# Patient Record
Sex: Female | Born: 1985 | Race: Black or African American | Hispanic: No | Marital: Married | Smoking: Never smoker
Health system: Southern US, Community
[De-identification: ages and names within clinical notes are randomized; demographics above are authoritative.]

## PROBLEM LIST (undated history)

## (undated) DIAGNOSIS — I4891 Unspecified atrial fibrillation: Secondary | ICD-10-CM

## (undated) DIAGNOSIS — E119 Type 2 diabetes mellitus without complications: Secondary | ICD-10-CM

## (undated) DIAGNOSIS — F419 Anxiety disorder, unspecified: Secondary | ICD-10-CM

## (undated) DIAGNOSIS — M109 Gout, unspecified: Secondary | ICD-10-CM

## (undated) DIAGNOSIS — G4730 Sleep apnea, unspecified: Secondary | ICD-10-CM

## (undated) DIAGNOSIS — E785 Hyperlipidemia, unspecified: Secondary | ICD-10-CM

## (undated) DIAGNOSIS — I10 Essential (primary) hypertension: Secondary | ICD-10-CM

## (undated) DIAGNOSIS — D649 Anemia, unspecified: Secondary | ICD-10-CM

## (undated) HISTORY — DX: Hyperlipidemia, unspecified: E78.5

## (undated) HISTORY — DX: Type 2 diabetes mellitus without complications: E11.9

## (undated) HISTORY — PX: INCISION AND DRAINAGE PERIRECTAL ABSCESS: SHX1804

## (undated) HISTORY — PX: TONSILLECTOMY AND ADENOIDECTOMY: SUR1326

## (undated) HISTORY — PX: ABSCESS DRAINAGE: SHX1119

## (undated) HISTORY — DX: Essential (primary) hypertension: I10

## (undated) HISTORY — DX: Gout, unspecified: M10.9

## (undated) HISTORY — DX: Unspecified atrial fibrillation: I48.91

## (undated) HISTORY — DX: Anemia, unspecified: D64.9

---

## 2015-11-17 ENCOUNTER — Encounter: Payer: Self-pay | Admitting: *Deleted

## 2015-11-18 ENCOUNTER — Encounter: Payer: Self-pay | Admitting: *Deleted

## 2015-11-18 ENCOUNTER — Ambulatory Visit (INDEPENDENT_AMBULATORY_CARE_PROVIDER_SITE_OTHER): Payer: BLUE CROSS/BLUE SHIELD | Admitting: Cardiovascular Disease

## 2015-11-18 VITALS — BP 119/78 | HR 86 | Ht 67.0 in | Wt 260.0 lb

## 2015-11-18 DIAGNOSIS — R0683 Snoring: Secondary | ICD-10-CM

## 2015-11-18 DIAGNOSIS — R5383 Other fatigue: Secondary | ICD-10-CM | POA: Diagnosis not present

## 2015-11-18 DIAGNOSIS — D508 Other iron deficiency anemias: Secondary | ICD-10-CM

## 2015-11-18 DIAGNOSIS — N939 Abnormal uterine and vaginal bleeding, unspecified: Secondary | ICD-10-CM

## 2015-11-18 DIAGNOSIS — I480 Paroxysmal atrial fibrillation: Secondary | ICD-10-CM

## 2015-11-18 DIAGNOSIS — D259 Leiomyoma of uterus, unspecified: Secondary | ICD-10-CM

## 2015-11-18 DIAGNOSIS — Z87898 Personal history of other specified conditions: Secondary | ICD-10-CM

## 2015-11-18 DIAGNOSIS — Z9289 Personal history of other medical treatment: Secondary | ICD-10-CM

## 2015-11-18 DIAGNOSIS — R079 Chest pain, unspecified: Secondary | ICD-10-CM

## 2015-11-18 DIAGNOSIS — I10 Essential (primary) hypertension: Secondary | ICD-10-CM

## 2015-11-18 DIAGNOSIS — M1000 Idiopathic gout, unspecified site: Secondary | ICD-10-CM

## 2015-11-18 DIAGNOSIS — Z7189 Other specified counseling: Secondary | ICD-10-CM

## 2015-11-18 NOTE — Addendum Note (Signed)
Addended by: Laurine Blazer on: 11/18/2015 10:22 AM   Modules accepted: Orders

## 2015-11-18 NOTE — Patient Instructions (Addendum)
Your physician has requested that you have en exercise stress myoview. For further information please visit [email or web address]. Please follow instruction sheet, as given. Your physician has recommended that you have a sleep study. This test records several body functions during sleep, including: brain activity, eye movement, oxygen and carbon dioxide blood levels, heart rate and rhythm, breathing rate and rhythm, the flow of air through your mouth and nose, snoring, body muscle movements, and chest and belly movement. Office will contact with results via phone or letter.   Continue all current medications. Follow up in  2 months.

## 2015-11-18 NOTE — Progress Notes (Signed)
Patient ID: Jaime Ho, female   DOB: 02-16-1986, 30 y.o.   MRN: [id number]       CARDIOLOGY CONSULT NOTE  Patient ID: Jaime Ho MRN: [id number] DOB/AGE: 1985/10/03 30 y.o.  Admit date: (Not on file) Primary Physician: No primary care provider on file. Referring Physician: Beckie Salts MD  Reason for Consultation: atrial fibrillation  HPI: The patient is a 30 year old woman. She was hospitalized for acute vaginal bleeding secondary to Eliquis and uterine fibroids at [facility] one week ago. She was found to have new onset atrial fibrillation and had chest pressure with this a few days prior. She also has a history of hypertension, hyperlipidemia, gout, and diabetes. Troponins were checked and were negative. Atrial fibrillation spontaneously converted to normal sinus rhythm.   She had been hospitalized a few days prior to this for new onset age fibrillation at which time Eliquis and metoprolol were started. She was subsequently hospitalized for excessive vaginal bleeding. Eliquis was stopped.   I personally reviewed all labs, studies, and documentation pertaining to the most recent hospitalization. Labs 11/12/15 BUN 13, creatinine 0.8, magnesium 1.7, hemoglobin A1c 8.1%. Hemoglobin 9.5 on 5/26, platelets 354. A transvaginal ultrasound on 5/25 demonstrated uterine fibroids. EKGs I reviewed demonstrated sinus rhythm with nonspecific T-wave abnormalities.  For several months she has been feeling fatigued. She has had episodic chest tightness both with exertion and at rest. This chest pain has occasionally woken her up from sleep. She has had dizziness which has preceded the initiation of metoprolol. She has had episodic palpitations associated with a cough and she thought it was due to anxiety. She was found to be allergic to lisinopril and had an adverse reaction to both Actos and amlodipine. She has had problems with gout in spite of eliminating elements of her diet which would  increase uric acid levels. Upon further discussion, she has a history of snoring but has never had a sleep study.  She reportedly had an echocardiogram performed during her first hospitalization. I do not have a copy of this nor ECGs demonstrating atrial fibrillation. She is anemic. She is having Hemoccults checked by her PCP.    Allergies  Allergen Reactions  . Lisinopril Swelling    Throat swelling  . Shellfish Allergy Swelling    Throat swelling  . Actos [Pioglitazone]     ? Irregular heart beat? Unsure if it caused this but was taken off of it while inpatient at [facility]  . Amlodipine Other (See Comments)    Causes patient to urinate a lot  . Benadryl [Diphenhydramine] Hives  . Eliquis [Apixaban]     Caused patient to bleed and have clots    Current Outpatient Prescriptions  Medication Sig Dispense Refill  . allopurinol (ZYLOPRIM) 100 MG tablet Take 100 mg by mouth daily.    Marland Kitchen aspirin EC 81 MG tablet Take 81 mg by mouth daily as needed.    Marland Kitchen atorvastatin (LIPITOR) 20 MG tablet Take 20 mg by mouth daily.    Marland Kitchen docusate sodium (COLACE) 100 MG capsule Take 100 mg by mouth daily as needed for mild constipation.    . ferrous sulfate 325 (65 FE) MG tablet Take 325 mg by mouth daily with breakfast.    . glipiZIDE (GLUCOTROL) 10 MG tablet Take 10 mg by mouth daily before breakfast.    . hydrOXYzine (ATARAX/VISTARIL) 25 MG tablet Take 25 mg by mouth daily as needed.    . metFORMIN (GLUCOPHAGE) 1000 MG tablet Take 1,000 mg by mouth  2 (two) times daily with a meal.    . metoprolol tartrate (LOPRESSOR) 25 MG tablet Take 12.5 mg by mouth 2 (two) times daily.     Marland Kitchen triamterene-hydrochlorothiazide (MAXZIDE-25) 37.5-25 MG tablet Take 1 tablet by mouth daily.     No current facility-administered medications for this visit.    Past Medical History  Diagnosis Date  . Hypertension   . Hyperlipidemia   . Diabetes mellitus without complication ([place])   . Gout   . Atrial fibrillation ([place])   .  Anemia     Past Surgical History  Procedure Laterality Date  . Incision and drainage perirectal abscess    . Abscess drainage    . Tonsillectomy and adenoidectomy      Social History   Social History  . Marital Status: Married    Spouse Name: N/A  . Number of Children: N/A  . Years of Education: N/A   Occupational History  . Not on file.   Social History Main Topics  . Smoking status: Never Smoker   . Smokeless tobacco: Never Used  . Alcohol Use: No  . Drug Use: Not on file  . Sexual Activity: Not on file   Other Topics Concern  . Not on file   Social History Narrative     No family history of premature CAD in 1st degree relatives.  Prior to Admission medications   Medication Sig Start Date End Date Taking? Authorizing Provider  allopurinol (ZYLOPRIM) 100 MG tablet Take 100 mg by mouth daily.    Historical Provider, MD  atorvastatin (LIPITOR) 20 MG tablet Take 20 mg by mouth daily.    Historical Provider, MD  glipiZIDE (GLUCOTROL) 10 MG tablet Take 10 mg by mouth daily before breakfast.    Historical Provider, MD  meloxicam (MOBIC) 7.5 MG tablet Take 7.5 mg by mouth daily.    Historical Provider, MD  metFORMIN (GLUCOPHAGE) 1000 MG tablet Take 1,000 mg by mouth 2 (two) times daily with a meal.    Historical Provider, MD  metoprolol tartrate (LOPRESSOR) 25 MG tablet Take 25 mg by mouth 2 (two) times daily.    Historical Provider, MD  triamterene-hydrochlorothiazide (MAXZIDE-25) 37.5-25 MG tablet Take 1 tablet by mouth daily.    Historical Provider, MD     Review of systems complete and found to be negative unless listed above in HPI     Physical exam Blood pressure 119/78, pulse 86, height 5\' 7"  (1.702 m), weight 260 lb (117.935 kg). General: NAD Neck: No JVD, no thyromegaly or thyroid nodule.  Lungs: Clear to auscultation bilaterally with normal respiratory effort. CV: Nondisplaced PMI. Regular rate and rhythm, normal S1/S2, no S3/S4, no murmur.  Trivial  peripheral edema.  No carotid bruit.  Normal pedal pulses.  Abdomen: Soft, nontender, obese, no distention.  Skin: Intact without lesions or rashes.  Neurologic: Alert and oriented x 3.  Psych: Normal affect. Extremities: No clubbing or cyanosis.  HEENT: Normal.   ECG: Most recent ECG reviewed.  Labs:  No results found for: WBC, HGB, HCT, MCV, PLT No results for input(s): NA, K, CL, CO2, BUN, CREATININE, CALCIUM, PROT, BILITOT, ALKPHOS, ALT, AST, GLUCOSE in the last 168 hours.  Invalid input(s): LABALBU No results found for: CKTOTAL, CKMB, CKMBINDEX, TROPONINI No results found for: CHOL No results found for: HDL No results found for: LDLCALC No results found for: TRIG No results found for: CHOLHDL No results found for: LDLDIRECT       Studies: No results found.  ASSESSMENT  AND PLAN:  1. Paroxysmal atrial fibrillation: She is presently symptomatically stable and taking metoprolol 12.5 mg twice daily. She is currently not on anticoagulation due to profuse vaginal bleeding with fibroids. CHADSVASC score is 3 (HTN, DM, gender), thus anticoagulation is certainly warranted. However, given her profuse vaginal bleeding from uterine fibroids, this makes things challenging.  I have recommended she see her gynecologist as soon as possible to see if her uterine fibroids can be treated effectively. If so, I could challenge her again with anticoagulation which she requires to prevent a stroke. Given her snoring and fatigue, she may have sleep apnea which also is a risk factor for atrial fibrillation. I will proceed with a sleep study. I will obtain a copy of her echocardiogram as well. I will also try to obtain ECGs demonstrating atrial fibrillation.  2. Essential HTN: Controlled. No changes for now. Wonder if HCTZ is increasing uric acid levels and leading to gout flares.  3. Hyperlipidemia: Continue Lipitor.  4. Chest pain and fatigue: She has several risk factors for ischemic heart disease.  I will proceed with a nuclear myocardial perfusion imaging study (exercise Myoview) to evaluate for ischemic heart disease.  5. Snoring and fatigue: Given her snoring and fatigue, she may have sleep apnea which also is a risk factor for atrial fibrillation. I will proceed with a sleep study.  6. Gout: Wonder if HCTZ is increasing uric acid levels and leading to gout flares. The timing of initiation of hydrochlorothiazide and gout flares will need to be evaluated by her PCP. I have discussed this at length with the patient.  7. Anemia: Hemoccults being checked.  8. Vaginal bleeding and fibroids: I have recommended she see her gynecologist as soon as possible to see if her uterine fibroids can be treated effectively. If so, I could challenge her again with anticoagulation which she requires to prevent a stroke.  Dispo: fu 2 months.   Signed: Kate Sable, M.D., F.A.C.C.  11/18/2015, 9:23 AM

## 2015-11-26 ENCOUNTER — Encounter (HOSPITAL_COMMUNITY)
Admission: RE | Admit: 2015-11-26 | Discharge: 2015-11-26 | Disposition: A | Discharge status: Home / Self Care | Payer: BLUE CROSS/BLUE SHIELD | Source: Ambulatory Visit | Attending: Cardiovascular Disease | Admitting: Cardiovascular Disease

## 2015-11-26 ENCOUNTER — Inpatient Hospital Stay (HOSPITAL_COMMUNITY): Admission: RE | Admit: 2015-11-26 | Payer: BLUE CROSS/BLUE SHIELD | Source: Ambulatory Visit

## 2015-11-26 ENCOUNTER — Encounter (HOSPITAL_COMMUNITY): Payer: Self-pay

## 2015-11-26 DIAGNOSIS — R5383 Other fatigue: Secondary | ICD-10-CM

## 2015-11-26 DIAGNOSIS — R079 Chest pain, unspecified: Secondary | ICD-10-CM

## 2015-11-26 LAB — NM MYOCAR MULTI W/SPECT W/WALL MOTION / EF
CHL CUP NUCLEAR SDS: 3
CHL CUP NUCLEAR SRS: 0
CHL CUP NUCLEAR SSS: 3
CSEPED: 5 min
Estimated workload: 7 METS
Exercise duration (sec): 21 s
LV dias vol: 65 mL (ref 46–106)
LV sys vol: 27 mL
MPHR: 189 {beats}/min
Peak HR: 171 {beats}/min
Percent HR: 90 %
RATE: 0.33
RPE: 11
Rest HR: 83 {beats}/min
TID: 1

## 2015-11-26 MED ORDER — TECHNETIUM TC 99M TETROFOSMIN IV KIT
10.0000 | PACK | Freq: Once | INTRAVENOUS | Status: AC | PRN
  Administered 2015-11-26: 10.9 via INTRAVENOUS

## 2015-11-26 MED ORDER — TECHNETIUM TC 99M TETROFOSMIN IV KIT
30.0000 | PACK | Freq: Once | INTRAVENOUS | Status: AC | PRN
  Administered 2015-11-26: 32 via INTRAVENOUS

## 2015-11-26 MED ORDER — SODIUM CHLORIDE 0.9% FLUSH
INTRAVENOUS | Status: AC
  Administered 2015-11-26: 10 mL via INTRAVENOUS
  Filled 2015-11-26: qty 10

## 2015-11-26 MED ORDER — REGADENOSON 0.4 MG/5ML IV SOLN
INTRAVENOUS | Status: AC
  Filled 2015-11-26: qty 5

## 2016-01-17 ENCOUNTER — Ambulatory Visit: Payer: BLUE CROSS/BLUE SHIELD | Admitting: Cardiovascular Disease

## 2019-10-02 ENCOUNTER — Other Ambulatory Visit: Payer: Self-pay

## 2019-10-02 ENCOUNTER — Emergency Department (HOSPITAL_COMMUNITY)
Admission: EM | Admit: 2019-10-02 | Discharge: 2019-10-02 | Disposition: A | Discharge status: Home / Self Care | Payer: BC Managed Care – PPO | Attending: Emergency Medicine | Admitting: Emergency Medicine

## 2019-10-02 ENCOUNTER — Encounter (HOSPITAL_COMMUNITY): Payer: Self-pay

## 2019-10-02 ENCOUNTER — Emergency Department (HOSPITAL_COMMUNITY): Payer: BC Managed Care – PPO

## 2019-10-02 DIAGNOSIS — Z7984 Long term (current) use of oral hypoglycemic drugs: Secondary | ICD-10-CM | POA: Insufficient documentation

## 2019-10-02 DIAGNOSIS — E119 Type 2 diabetes mellitus without complications: Secondary | ICD-10-CM | POA: Insufficient documentation

## 2019-10-02 DIAGNOSIS — Z7982 Long term (current) use of aspirin: Secondary | ICD-10-CM | POA: Diagnosis not present

## 2019-10-02 DIAGNOSIS — D649 Anemia, unspecified: Secondary | ICD-10-CM | POA: Insufficient documentation

## 2019-10-02 DIAGNOSIS — R1030 Lower abdominal pain, unspecified: Secondary | ICD-10-CM | POA: Insufficient documentation

## 2019-10-02 DIAGNOSIS — D219 Benign neoplasm of connective and other soft tissue, unspecified: Secondary | ICD-10-CM | POA: Insufficient documentation

## 2019-10-02 DIAGNOSIS — Z79899 Other long term (current) drug therapy: Secondary | ICD-10-CM | POA: Diagnosis not present

## 2019-10-02 DIAGNOSIS — I10 Essential (primary) hypertension: Secondary | ICD-10-CM | POA: Insufficient documentation

## 2019-10-02 DIAGNOSIS — R1032 Left lower quadrant pain: Secondary | ICD-10-CM | POA: Diagnosis present

## 2019-10-02 DIAGNOSIS — N390 Urinary tract infection, site not specified: Secondary | ICD-10-CM | POA: Diagnosis not present

## 2019-10-02 DIAGNOSIS — R102 Pelvic and perineal pain: Secondary | ICD-10-CM

## 2019-10-02 HISTORY — DX: Anxiety disorder, unspecified: F41.9

## 2019-10-02 HISTORY — DX: Sleep apnea, unspecified: G47.30

## 2019-10-02 LAB — CBC WITH DIFFERENTIAL/PLATELET
Abs Immature Granulocytes: 0.02 10*3/uL (ref 0.00–0.07)
Basophils Absolute: 0 10*3/uL (ref 0.0–0.1)
Basophils Relative: 0 %
Eosinophils Absolute: 0.2 10*3/uL (ref 0.0–0.5)
Eosinophils Relative: 3 %
HCT: 27.8 % — ABNORMAL LOW (ref 36.0–46.0)
Hemoglobin: 8 g/dL — ABNORMAL LOW (ref 12.0–15.0)
Immature Granulocytes: 0 %
Lymphocytes Relative: 29 %
Lymphs Abs: 2 10*3/uL (ref 0.7–4.0)
MCH: 23 pg — ABNORMAL LOW (ref 26.0–34.0)
MCHC: 28.8 g/dL — ABNORMAL LOW (ref 30.0–36.0)
MCV: 79.9 fL — ABNORMAL LOW (ref 80.0–100.0)
Monocytes Absolute: 0.5 10*3/uL (ref 0.1–1.0)
Monocytes Relative: 7 %
Neutro Abs: 4.1 10*3/uL (ref 1.7–7.7)
Neutrophils Relative %: 61 %
Platelets: 349 10*3/uL (ref 150–400)
RBC: 3.48 MIL/uL — ABNORMAL LOW (ref 3.87–5.11)
RDW: 15.9 % — ABNORMAL HIGH (ref 11.5–15.5)
WBC: 6.8 10*3/uL (ref 4.0–10.5)
nRBC: 0 % (ref 0.0–0.2)

## 2019-10-02 LAB — COMPREHENSIVE METABOLIC PANEL
ALT: 23 U/L (ref 0–44)
AST: 26 U/L (ref 15–41)
Albumin: 3.5 g/dL (ref 3.5–5.0)
Alkaline Phosphatase: 99 U/L (ref 38–126)
Anion gap: 12 (ref 5–15)
BUN: 16 mg/dL (ref 6–20)
CO2: 21 mmol/L — ABNORMAL LOW (ref 22–32)
Calcium: 8.7 mg/dL — ABNORMAL LOW (ref 8.9–10.3)
Chloride: 102 mmol/L (ref 98–111)
Creatinine, Ser: 0.99 mg/dL (ref 0.44–1.00)
GFR calc Af Amer: >60 mL/min (ref >60)
GFR calc non Af Amer: >60 mL/min (ref >60)
Glucose, Bld: 145 mg/dL — ABNORMAL HIGH (ref 70–99)
Potassium: 4.2 mmol/L (ref 3.5–5.1)
Sodium: 135 mmol/L (ref 135–145)
Total Bilirubin: 0.5 mg/dL (ref 0.3–1.2)
Total Protein: 7.6 g/dL (ref 6.5–8.1)

## 2019-10-02 LAB — URINALYSIS, ROUTINE W REFLEX MICROSCOPIC
Bacteria, UA: NONE SEEN
Bilirubin Urine: NEGATIVE
Glucose, UA: NEGATIVE mg/dL
Hgb urine dipstick: NEGATIVE
Ketones, ur: NEGATIVE mg/dL
Leukocytes,Ua: NEGATIVE
Nitrite: NEGATIVE
Protein, ur: 100 mg/dL — AB
Specific Gravity, Urine: 1.009 (ref 1.005–1.030)
pH: 6 (ref 5.0–8.0)

## 2019-10-02 LAB — WET PREP, GENITAL
Clue Cells Wet Prep HPF POC: NONE SEEN
Sperm: NONE SEEN
Trich, Wet Prep: NONE SEEN
Yeast Wet Prep HPF POC: NONE SEEN

## 2019-10-02 LAB — PREGNANCY, URINE: Preg Test, Ur: NEGATIVE

## 2019-10-02 LAB — HIV ANTIBODY (ROUTINE TESTING W REFLEX): HIV Screen 4th Generation wRfx: NONREACTIVE

## 2019-10-02 LAB — LIPASE, BLOOD: Lipase: 47 U/L (ref 11–51)

## 2019-10-02 LAB — POC OCCULT BLOOD, ED: Fecal Occult Bld: NEGATIVE

## 2019-10-02 MED ORDER — FERROUS SULFATE 325 (65 FE) MG PO TABS: 325.0000 mg | ORAL_TABLET | Freq: Every day | ORAL | 0 refills | Status: AC

## 2019-10-02 NOTE — ED Provider Notes (Signed)
[facility] EMERGENCY DEPARTMENT Provider Note   CSN: [id number] Arrival date & time: 10/02/19  [id number]     History Chief Complaint  Patient presents with  . Pelvic Pain    Jaime Ho is a 34 y.o. female history A. fib, diabetes, hypertension, hyperlipidemia, sleep apnea, obesity.  Patient presents today for left lower quadrant pain.  Patient reports today that her pain began 9 days ago when she began her menstrual cycle.  With this was the normal time for her menstrual cycle to begin, it lasted around 5 days coming to and in this past Sunday.  She reports that throughout her menstrual cycle she had bilateral lower abdominal cramping which was consistent with her normal cycle.  She reports after her menstrual cycle ended she had some lingering pain in her left lower abdomen, a mild cramping constant nonradiating.  As pain continued she sought care at urgent care 2 days ago.  She reports she was diagnosed with UTI and yeast infection.  She is currently being treated for UTI with Keflex 500 twice daily as well as Diflucan for yeast infection.  Patient reports that her symptoms have begun to improve however as they were not completely resolved in 2 days she reports she was informed to return to the ER for evaluation of ovarian pain.  Secondarily patient mention eye drainage and nasal congestion to triage staff.  I brought this up with the patient she reports that she has allergies this time of year every year, she has been treating with Claritin with relief.  Of note patient has history of A. fib, apparently is paroxysmal, she is not on any blood thinners.  She reports he takes metoprolol for control.  She denies any palpitations, chest pain or shortness of breath.  Denies fever/chills, headache, vision changes, sore throat, neck stiffness, chest pain/shortness of breath, nausea/vomiting, dysuria/hematuria, vaginal bleeding, numbness/tingling, weakness or any additional concerns.  HPI      Past Medical History:  Diagnosis Date  . Anemia   . Anxiety   . Atrial fibrillation ([place])   . Diabetes mellitus without complication ([place])   . Gout   . Hyperlipidemia   . Hypertension   . Sleep apnea     There are no problems to display for this patient.   Past Surgical History:  Procedure Laterality Date  . ABSCESS DRAINAGE    . INCISION AND DRAINAGE PERIRECTAL ABSCESS    . TONSILLECTOMY AND ADENOIDECTOMY       OB History   No obstetric history on file.     Family History  Problem Relation Age of Onset  . Diabetes Mother   . Hypertension Mother   . Anemia Mother   . Hyperlipidemia Mother   . Stroke Maternal Aunt   . Heart attack Maternal Aunt   . Diabetes Maternal Grandmother   . Hypertension Maternal Grandmother   . Hyperlipidemia Maternal Grandmother   . Diabetes Maternal Grandfather   . Hypertension Maternal Grandfather   . Cancer Maternal Grandfather   . Heart attack Paternal Grandfather   . Anemia Sister   . Stroke Paternal Aunt   . Diabetes Other   . Hypertension Other     Social History   Tobacco Use  . Smoking status: Never Smoker  . Smokeless tobacco: Never Used  Substance Use Topics  . Alcohol use: No    Alcohol/week: 0.0 standard drinks  . Drug use: Not on file    Home Medications Prior to Admission medications  Medication Sig Start Date End Date Taking? Authorizing Provider  allopurinol (ZYLOPRIM) 100 MG tablet Take 100 mg by mouth daily.   Yes [provider]  aspirin EC 81 MG tablet Take 81 mg by mouth daily as needed.   Yes [provider]  atorvastatin (LIPITOR) 20 MG tablet Take 20 mg by mouth daily.   Yes [provider]  cephALEXin (KEFLEX) 500 MG capsule Take 500 mg by mouth 2 (two) times daily. 09/30/19  Yes [provider]  Cranberry 180 MG CAPS Take 2 capsules by mouth daily.   Yes [provider]  fluconazole (DIFLUCAN) 150 MG tablet Take 150 mg by mouth once. 09/30/19  Yes  [provider]  fluticasone (FLONASE) 50 MCG/ACT nasal spray Place 1 spray into both nostrils daily.   Yes [provider]  Glycerin-Hypromellose-PEG 400 (VISINE DRY EYE) 0.2-0.2-1 % SOLN Place 1-2 drops into both eyes daily as needed.   Yes [provider]  hydrochlorothiazide (HYDRODIURIL) 25 MG tablet Take 25 mg by mouth every morning. 09/25/19  Yes [provider]  hydrocortisone 2.5 % cream Apply 1 application topically daily as needed. 06/11/19  Yes [provider]  LANTUS SOLOSTAR 100 UNIT/ML Solostar Pen Inject 46 Units into the skin in the morning and at bedtime. 08/13/19  Yes [provider]  loratadine (CLARITIN) 10 MG tablet Take 10 mg by mouth daily.   Yes [provider]  losartan (COZAAR) 100 MG tablet Take 100 mg by mouth daily. 09/01/19  Yes [provider]  metFORMIN (GLUCOPHAGE) 1000 MG tablet Take 1,000 mg by mouth 2 (two) times daily with a meal.   Yes [provider]  metoprolol tartrate (LOPRESSOR) 50 MG tablet Take 50 mg by mouth 2 (two) times daily. 09/14/19  Yes [provider]  minocycline (MINOCIN) 100 MG capsule Take 100 mg by mouth daily as needed (acne).  05/30/19  Yes [provider]  olopatadine (PATADAY) 0.1 % ophthalmic solution Place 1 drop into both eyes 2 (two) times daily as needed for allergies.   Yes [provider]  omeprazole (PRILOSEC) 40 MG capsule Take 40 mg by mouth daily. 09/09/19  Yes [provider]  ferrous sulfate 325 (65 FE) MG tablet Take 1 tablet (325 mg total) by mouth daily. 10/02/19   Nuala Alpha A, PA-C    Allergies    Lisinopril, Shellfish allergy, Actos [pioglitazone], Amlodipine, Benadryl [diphenhydramine], and Eliquis [apixaban]  Review of Systems   Review of Systems Ten systems are reviewed and are negative for acute change except as noted in the HPI  Physical Exam Updated Vital Signs BP (!) 147/79 (BP Location:  Right Arm)   Pulse 96   Temp 98.5 F (36.9 C) (Oral)   Resp 18   Ht 5\' 7"  (1.702 m)   Wt 128.8 kg   LMP 09/24/2019   SpO2 100%   BMI 44.48 kg/m   Physical Exam Constitutional:      General: She is not in acute distress.    Appearance: Normal appearance. She is well-developed. She is not ill-appearing or diaphoretic.  HENT:     Head: Normocephalic and atraumatic.     Right Ear: External ear normal.     Left Ear: External ear normal.     Nose: Nose normal.  Eyes:     General: Vision grossly intact. Gaze aligned appropriately.     Pupils: Pupils are equal, round, and reactive to light.  Neck:     Trachea: Trachea  and phonation normal. No tracheal deviation.  Pulmonary:     Effort: Pulmonary effort is normal. No respiratory distress.  Abdominal:     General: There is no distension.     Palpations: Abdomen is soft.     Tenderness: There is abdominal tenderness in the suprapubic area and left lower quadrant. There is no guarding or rebound.  Genitourinary:    Comments: Exam chaperoned by [employer].  Pelvic exam: normal external genitalia without evidence of trauma. VULVA: normal appearing vulva with no masses, tenderness or lesion. VAGINA: normal appearing vagina with normal color and discharge, no lesions. CERVIX: normal appearing cervix without lesions, cervical motion tenderness absent, cervical os closed without purulent discharge; vaginal discharge scant white  Wet prep and DNA probe for chlamydia and GC obtained.  ADNEXA: normal adnexa in size, nontender and no masses UTERUS: uterus is normal size, shape, consistency and nontender.  Musculoskeletal:        General: Normal range of motion.     Cervical back: Normal range of motion.  Skin:    General: Skin is warm and dry.  Neurological:     Mental Status: She is alert.     GCS: GCS eye subscore is 4. GCS verbal subscore is 5. GCS motor subscore is 6.     Comments: Speech is clear and goal oriented, follows  commands Major Cranial nerves without deficit, no facial droop Moves extremities without ataxia, coordination intact  Psychiatric:        Behavior: Behavior normal.     ED Results / Procedures / Treatments   Labs (all labs ordered are listed, but only abnormal results are displayed) Labs Reviewed  WET PREP, GENITAL - Abnormal; Notable for the following components:      Result Value   WBC, Wet Prep HPF POC RARE (*)    All other components within normal limits  URINALYSIS, ROUTINE W REFLEX MICROSCOPIC - Abnormal; Notable for the following components:   Color, Urine STRAW (*)    Protein, ur 100 (*)    All other components within normal limits  CBC WITH DIFFERENTIAL/PLATELET - Abnormal; Notable for the following components:   RBC 3.48 (*)    Hemoglobin 8.0 (*)    HCT 27.8 (*)    MCV 79.9 (*)    MCH 23.0 (*)    MCHC 28.8 (*)    RDW 15.9 (*)    All other components within normal limits  COMPREHENSIVE METABOLIC PANEL - Abnormal; Notable for the following components:   CO2 21 (*)    Glucose, Bld 145 (*)    Calcium 8.7 (*)    All other components within normal limits  URINE CULTURE  PREGNANCY, URINE  LIPASE, BLOOD  RPR  HIV ANTIBODY (ROUTINE TESTING W REFLEX)  POC OCCULT BLOOD, ED  GC/CHLAMYDIA PROBE AMP ([place]) NOT AT [facility]    EKG None  Radiology US PELVIC COMPLETE W TRANSVAGINAL AND TORSION R/O  Result Date: 10/02/2019 CLINICAL DATA:  LEFT lower quadrant pain, current UTI, history hypertension, diabetes mellitus EXAM: TRANSABDOMINAL AND TRANSVAGINAL ULTRASOUND OF PELVIS DOPPLER ULTRASOUND OF OVARIES TECHNIQUE: Both transabdominal and transvaginal ultrasound examinations of the pelvis were performed. Transabdominal technique was performed for global imaging of the pelvis including uterus, ovaries, adnexal regions, and pelvic cul-de-sac. It was necessary to proceed with endovaginal exam following the transabdominal exam to visualize the endometrium and ovaries. Color and  duplex Doppler ultrasound was utilized to evaluate blood flow to the ovaries. COMPARISON:  11/11/2015 FINDINGS: Uterus Measurements:  9.4 x 4.4 x 6.9 cm = volume: 151 mL. Anteverted and minimally retroflexed. Posterior transmural leiomyoma at upper uterus 4.2 x 4.2 x 4.0 cm. Additional small intramural leiomyoma at fundus 2.3 x 2.1 x 3.3 cm. Small intramural leiomyoma at upper uterus 3.0 x 2.0 x 2.6 cm Endometrium Thickness: 15 mm.  No endometrial fluid or focal abnormality Right ovary Measurements: 4.8 x 3.1 x 3.9 cm = volume: 30 mL. Dominant follicle without additional mass. Blood flow present within RIGHT ovary on color Doppler imaging. Left ovary Measurements: 4.1 x 2.1 x 2.9 cm = volume: 13.1 mL. Normal morphology without mass. Blood flow present within LEFT ovary on color Doppler imaging. Pulsed Doppler evaluation of both ovaries demonstrates normal low-resistance arterial and venous waveforms. Other findings Small amount of free fluid in LEFT adnexa.  No adnexal masses. IMPRESSION: Three uterine leiomyomata are identified at least 1 of which extend submucosal as above. Unremarkable endometrial complex and ovaries. No evidence of ovarian torsion. Electronically Signed   By: Lavonia Dana M.D.   On: 10/02/2019 12:59    Procedures Procedures (including critical care time)  Medications Ordered in ED Medications - No data to display  ED Course  I have reviewed the triage vital signs and the nursing notes.  Pertinent labs & imaging results that were available during my care of the patient were reviewed by me and considered in my medical decision making (see chart for details).  Clinical Course as of Oct 02 1542  Thu Oct 02, 2019  1422 Vaughan Basta   [BM]    Clinical Course User Index [BM] Gari Crown   MDM Rules/Calculators/A&P                      34 year old female with history as detailed above presents today for concern of lower abdominal cramping.  This began when she had her normal  menstrual cycle last week, menstrual cycle ended on Sunday however lingering left lower quadrant pain brought patient to an urgent care where she was diagnosed with UTI and yeast infection.  She is been treated with Keflex and Diflucan with some relief however lingering pain and she presents today for evaluation of torsion.  Patient denies any infectious symptoms or additional concerns.  She has allergies which she reports are relieved with Claritin and this does not concern her during my examination. - I have reviewed patient's EMR, reviewed encounter from urgent care in [place].  I am unable to review provider's note however I can see labs and encounter diagnoses.  Med review from that encounter shows no blood thinners.  She was prescribed Keflex 500 mg twice daily x7 days and fluconazole 150 mg p.o. once.  Urinalysis reviewed shows high specific gravity, 100 glucose, 300 protein, RBCs, WBCs, moderate bacteria.  Yeast present.  10 squamous cells present.  Nitrite and ketone and leukocyte negative.  Pregnancy test negative. - I have ordered reviewed and interpreted the following labs.  Lipase within normal limits doubt pancreatitis.  CMP shows glucose 145, CO2 21, calcium 8.7 otherwise within normal limits.  No gap, emergently dry derangement, evidence of kidney injury or acute elevation of LFTs.  Patient does not appear to be in DKA.  Pregnancy test negative. Urinalysis shows 100 protein, 6-10 squamous cells otherwise within normal limits no evidence of infection.  Wet prep shows rare WBCs, negative trichomoniasis, negative yeast, negative clue cells.  CBC shows hemoglobin 8.0, no prior to compare, no leukocytosis to suggest infection.  Patient appears to have microcytic anemia, she reports that she has very heavy menstrual cycles.  Suspect this as etiology of her low hemoglobin today, she is asymptomatic regarding her anemia at this time.  Patient denies any rectal bleeding or melena, she consented to  Hemoccult here in the ER.  Per RN Vaughan Basta Hemoccult was negative today.  No evidence of GI bleed.  Pelvc [place] w/ Torsion R/O:  IMPRESSION:  Three uterine leiomyomata are identified at least 1 of which extend  submucosal as above.    Unremarkable endometrial complex and ovaries.    No evidence of ovarian torsion.  - Patient has mild suprapubic and left lower quadrant abdominal pain today, this is resolving since she has been treated with Keflex.  Her UA does not appear infected today suspect that she has a resolving urinary tract infection, will advise that she continue taking Keflex as prescribed and follow-up with PCP and OB/GYN.  Suspect patient's symptoms today are exacerbated secondary to her uterine fibroids, no evidence of torsion or PID.  Patient is establishing an OB/GYN and has an appointment later on this week I encouraged her to discuss results above with him and to maintain her appointment.  Suspect patient's heavy menstrual cycles and fibroids may be because of her asymptomatic anemia today.  Will start patient on iron supplementation and have her follow-up with PCP and OB/GYN for hemoglobin recheck, discussed strict return precautions regarding anemia with the patient and she states understanding.  Additionally I performed a rectal examination which showed no gross blood and Hemoccult negative per assisting RN Vaughan Basta.  Doubt diverticulitis, GI bleed or other acute intra-abdominal pathologies at this time.  I discussed potential of CT scan with patient, shared decision-making was made and she refused, feel this is reasonable at this time as above suspect menstrual cycle and resolving UTI as etiology of pain and a symptomatic anemia.  Patient is also aware of pending STI tests and she will follow-up on the results on her MyChart account, she is not concern for STI today and chooses to follow-up on those results and seek treatment if needed.  At this time there does not appear to be any evidence  of an acute emergency medical condition and the patient appears stable for discharge with appropriate outpatient follow up. Diagnosis was discussed with patient who verbalizes understanding of care plan and is agreeable to discharge. I have discussed return precautions with patient who verbalizes understanding of return precautions. Patient encouraged to follow-up with their PCP and OBGYN. All questions answered.  Patient's case discussed with Dr. Laverta Baltimore who agrees with plan to discharge with follow-up.   Note: Portions of this report may have been transcribed using voice recognition software. Every effort was made to ensure accuracy; however, inadvertent computerized transcription errors may still be present.  Final Clinical Impression(s) / ED Diagnoses Final diagnoses:  Anemia, unspecified type  Lower abdominal pain  Fibroids  Urinary tract infection without hematuria, site unspecified    Rx / DC Orders ED Discharge Orders         Ordered    ferrous sulfate 325 (65 FE) MG tablet  Daily     10/02/19 1537           Gari Crown 10/02/19 1544    Margette Fast, MD 10/02/19 1722

## 2019-10-02 NOTE — Discharge Instructions (Signed)
You have been diagnosed today with lower abdominal pain, fibroids, anemia, improving urinary tract infection.  At this time there does not appear to be the presence of an emergent medical condition, however there is always the potential for conditions to change. Please read and follow the below instructions.  Please return to the Emergency Department immediately for any new or worsening symptoms. Please be sure to follow up with your Primary Care Provider within one week regarding your visit today; please call their office to schedule an appointment even if you are feeling better for a follow-up visit. As we discussed your hemoglobin was low today.  Please have your blood counts rechecked in the next 1-2 weeks to ensure they are improving.  Please take the iron supplements as prescribed to help with this. Please go to your OB/GYN appointment as scheduled for reevaluation.  Discussed with them your ultrasound reading including the fibroids as this may be a cause of your symptoms. Please finish taking the Keflex that you are prescribed at the urgent care, this appears to be treating your urinary tract infection well.  Take medication until complete.  We have sent her urine for a culture today if it grows out bacteria that require different antibiotics you will be contacted by [facility] health. Your STD tests including HIV, syphilis/RPR, gonorrhea and chlamydia are pending.  Please check your MyChart account in the next 2-3 days for your results.  If any of these results are positive you will need to go to your OB/GYN or return to the ER for treatment.  Your sexual partners will also need to be treated and tested as well if there is a positive test.  Please abstain from sex until results are available and use protection with every sexual encounter. Get help right away if: You are very weak. You are short of breath. You have pain in your abdomen or chest. You are dizzy or feel faint. You have trouble  concentrating. You have bloody or black, tarry stools. You vomit repeatedly or you vomit up blood.  Get help right away if: Your pain does not go away as soon as your doctor says it should. You cannot stop vomiting. Your pain is only in areas of your belly, such as the right side or the left lower part of the belly. You have bloody or black poop, or poop that looks like tar. You have very bad pain, cramping, or bloating in your belly. You have signs of not having enough fluid or water in your body (dehydration), such as: Dark pee, very little pee, or no pee. Cracked lips. Dry mouth. Sunken eyes. Sleepiness. Weakness. You have trouble breathing or chest pain. You have any new/concerning or worsening of symptoms  Please read the additional information packets attached to your discharge summary.  Do not take your medicine if  develop an itchy rash, swelling in your mouth or lips, or difficulty breathing; call 911 and seek immediate emergency medical attention if this occurs.  Note: Portions of this text may have been transcribed using voice recognition software. Every effort was made to ensure accuracy; however, inadvertent computerized transcription errors may still be present.

## 2019-10-02 NOTE — ED Notes (Signed)
POC occult blood negative

## 2019-10-02 NOTE — ED Triage Notes (Signed)
Pt says went to [facility] Tuesday for pelvic pain.  Was diagnosed with UTI and yeast infection.  Reports was given cephalaxin and diflucan.  Pt says feels a little better still having a lot of pain and pressure in left lower abd.  Pt also says is having problems with her allergies.  Reports r eye draining and nasal congestion.  Took claritin this morning.  Denies any cough or fever.  Denies any covid exposure.

## 2019-10-03 LAB — URINE CULTURE: Culture: <10000 — AB

## 2019-10-03 LAB — RPR: RPR Ser Ql: NONREACTIVE

## 2019-10-03 LAB — GC/CHLAMYDIA PROBE AMP (~~LOC~~) NOT AT ARMC
Chlamydia: NEGATIVE
Comment: NEGATIVE
Comment: NORMAL
Neisseria Gonorrhea: NEGATIVE

## 2020-02-29 ENCOUNTER — Emergency Department (HOSPITAL_COMMUNITY): Payer: BC Managed Care – PPO

## 2020-02-29 ENCOUNTER — Emergency Department (HOSPITAL_COMMUNITY)
Admission: EM | Admit: 2020-02-29 | Discharge: 2020-02-29 | Disposition: A | Discharge status: Home / Self Care | Payer: BC Managed Care – PPO | Attending: Emergency Medicine | Admitting: Emergency Medicine

## 2020-02-29 ENCOUNTER — Other Ambulatory Visit: Payer: Self-pay

## 2020-02-29 ENCOUNTER — Encounter (HOSPITAL_COMMUNITY): Payer: Self-pay | Admitting: Emergency Medicine

## 2020-02-29 DIAGNOSIS — N939 Abnormal uterine and vaginal bleeding, unspecified: Secondary | ICD-10-CM

## 2020-02-29 DIAGNOSIS — E119 Type 2 diabetes mellitus without complications: Secondary | ICD-10-CM | POA: Diagnosis not present

## 2020-02-29 DIAGNOSIS — D259 Leiomyoma of uterus, unspecified: Secondary | ICD-10-CM

## 2020-02-29 DIAGNOSIS — Z7984 Long term (current) use of oral hypoglycemic drugs: Secondary | ICD-10-CM | POA: Diagnosis not present

## 2020-02-29 DIAGNOSIS — Z79899 Other long term (current) drug therapy: Secondary | ICD-10-CM | POA: Diagnosis not present

## 2020-02-29 DIAGNOSIS — R110 Nausea: Secondary | ICD-10-CM | POA: Insufficient documentation

## 2020-02-29 DIAGNOSIS — Z20822 Contact with and (suspected) exposure to covid-19: Secondary | ICD-10-CM | POA: Insufficient documentation

## 2020-02-29 DIAGNOSIS — I10 Essential (primary) hypertension: Secondary | ICD-10-CM | POA: Insufficient documentation

## 2020-02-29 DIAGNOSIS — Z7982 Long term (current) use of aspirin: Secondary | ICD-10-CM | POA: Insufficient documentation

## 2020-02-29 LAB — CBC WITH DIFFERENTIAL/PLATELET
Abs Immature Granulocytes: 0.03 10*3/uL (ref 0.00–0.07)
Basophils Absolute: 0 10*3/uL (ref 0.0–0.1)
Basophils Relative: 0 %
Eosinophils Absolute: 0.2 10*3/uL (ref 0.0–0.5)
Eosinophils Relative: 2 %
HCT: 34.3 % — ABNORMAL LOW (ref 36.0–46.0)
Hemoglobin: 10.5 g/dL — ABNORMAL LOW (ref 12.0–15.0)
Immature Granulocytes: 0 %
Lymphocytes Relative: 20 %
Lymphs Abs: 1.8 10*3/uL (ref 0.7–4.0)
MCH: 28.2 pg (ref 26.0–34.0)
MCHC: 30.6 g/dL (ref 30.0–36.0)
MCV: 92.2 fL (ref 80.0–100.0)
Monocytes Absolute: 0.6 10*3/uL (ref 0.1–1.0)
Monocytes Relative: 7 %
Neutro Abs: 6.5 10*3/uL (ref 1.7–7.7)
Neutrophils Relative %: 71 %
Platelets: 360 10*3/uL (ref 150–400)
RBC: 3.72 MIL/uL — ABNORMAL LOW (ref 3.87–5.11)
RDW: 13.7 % (ref 11.5–15.5)
WBC: 9.1 10*3/uL (ref 4.0–10.5)
nRBC: 0 % (ref 0.0–0.2)

## 2020-02-29 LAB — COMPREHENSIVE METABOLIC PANEL
ALT: 18 U/L (ref 0–44)
AST: 18 U/L (ref 15–41)
Albumin: 3.7 g/dL (ref 3.5–5.0)
Alkaline Phosphatase: 67 U/L (ref 38–126)
Anion gap: 11 (ref 5–15)
BUN: 23 mg/dL — ABNORMAL HIGH (ref 6–20)
CO2: 19 mmol/L — ABNORMAL LOW (ref 22–32)
Calcium: 9.5 mg/dL (ref 8.9–10.3)
Chloride: 109 mmol/L (ref 98–111)
Creatinine, Ser: 1.82 mg/dL — ABNORMAL HIGH (ref 0.44–1.00)
GFR calc Af Amer: 41 mL/min — ABNORMAL LOW (ref >60)
GFR calc non Af Amer: 35 mL/min — ABNORMAL LOW (ref >60)
Glucose, Bld: 202 mg/dL — ABNORMAL HIGH (ref 70–99)
Potassium: 5.2 mmol/L — ABNORMAL HIGH (ref 3.5–5.1)
Sodium: 139 mmol/L (ref 135–145)
Total Bilirubin: 0.4 mg/dL (ref 0.3–1.2)
Total Protein: 8.1 g/dL (ref 6.5–8.1)

## 2020-02-29 LAB — TYPE AND SCREEN
ABO/RH(D): B POS
Antibody Screen: NEGATIVE

## 2020-02-29 LAB — PREGNANCY, URINE: Preg Test, Ur: NEGATIVE

## 2020-02-29 MED ORDER — KETOROLAC TROMETHAMINE 30 MG/ML IJ SOLN
30.0000 mg | Freq: Once | INTRAMUSCULAR | Status: AC
  Administered 2020-02-29: 30 mg via INTRAVENOUS
  Filled 2020-02-29: qty 1

## 2020-02-29 MED ORDER — ETODOLAC 300 MG PO CAPS: 300.0000 mg | ORAL_CAPSULE | Freq: Three times a day (TID) | ORAL | 0 refills | Status: AC

## 2020-02-29 NOTE — ED Provider Notes (Signed)
[facility] EMERGENCY DEPARTMENT Provider Note   CSN: [id number] Arrival date & time: 02/29/20  1517     History Chief Complaint  Patient presents with  . Vaginal Bleeding    Jaime Ho is a 34 y.o. female.  The history is provided by the patient.  Vaginal Bleeding Quality:  Bright red and clots Severity:  Moderate Onset quality:  Gradual Duration:  1 month Timing:  Constant Progression:  Worsening Possible pregnancy: no   Context: spontaneously   Relieved by:  Nothing Worsened by:  Nothing Ineffective treatments:  Prescription medications Associated symptoms: abdominal pain and nausea   Associated symptoms: no dysuria and no fatigue   Pt has been having bleeding for over a month.  She saw her doctor and was started on medications.  She started having a headache and felt nauseated so she stopped the medication the end of this week.  Pt started feeling lightheaded, passed clots and had more pain this am so shecame to the ED.     Past Medical History:  Diagnosis Date  . Anemia   . Anxiety   . Atrial fibrillation ([place])   . Diabetes mellitus without complication ([place])   . Gout   . Hyperlipidemia   . Hypertension   . Sleep apnea     There are no problems to display for this patient.   Past Surgical History:  Procedure Laterality Date  . ABSCESS DRAINAGE    . INCISION AND DRAINAGE PERIRECTAL ABSCESS    . TONSILLECTOMY AND ADENOIDECTOMY       OB History   No obstetric history on file.     Family History  Problem Relation Age of Onset  . Diabetes Mother   . Hypertension Mother   . Anemia Mother   . Hyperlipidemia Mother   . Stroke Maternal Aunt   . Heart attack Maternal Aunt   . Diabetes Maternal Grandmother   . Hypertension Maternal Grandmother   . Hyperlipidemia Maternal Grandmother   . Diabetes Maternal Grandfather   . Hypertension Maternal Grandfather   . Cancer Maternal Grandfather   . Heart attack Paternal Grandfather   . Anemia Sister    . Stroke Paternal Aunt   . Diabetes Other   . Hypertension Other     Social History   Tobacco Use  . Smoking status: Never Smoker  . Smokeless tobacco: Never Used  Substance Use Topics  . Alcohol use: No    Alcohol/week: 0.0 standard drinks  . Drug use: Not on file    Home Medications Prior to Admission medications   Medication Sig Start Date End Date Taking? Authorizing Provider  allopurinol (ZYLOPRIM) 100 MG tablet Take 100 mg by mouth in the morning.    Yes [provider]  Ascorbic Acid (VITAMIN C) 500 MG CAPS Take 500 mg by mouth daily.   Yes [provider]  aspirin EC 81 MG tablet Take 81 mg by mouth in the morning.    Yes [provider]  atorvastatin (LIPITOR) 40 MG tablet Take 40 mg by mouth in the morning.    Yes [provider]  azelastine (OPTIVAR) 0.05 % ophthalmic solution Place 1 drop into both eyes in the morning and at bedtime. 02/25/20  Yes [provider]  ferrous sulfate 325 (65 FE) MG tablet Take 1 tablet (325 mg total) by mouth daily. Patient taking differently: Take 325 mg by mouth in the morning.  10/02/19  Yes Nuala Alpha A, PA-C  glipiZIDE (GLUCOTROL) 5 MG  tablet Take 5 mg by mouth every morning. 02/13/20  Yes [provider]  Glycerin-Hypromellose-PEG 400 (VISINE DRY EYE) 0.2-0.2-1 % SOLN Place 1-2 drops into both eyes daily as needed.   Yes [provider]  hydrochlorothiazide (HYDRODIURIL) 25 MG tablet Take 25 mg by mouth every morning. 09/25/19  Yes [provider]  ibuprofen (ADVIL) 200 MG tablet Take 800 mg by mouth every 6 (six) hours as needed for mild pain or moderate pain.   Yes [provider]  LANTUS SOLOSTAR 100 UNIT/ML Solostar Pen Inject 50 Units into the skin in the morning and at bedtime.  08/13/19  Yes [provider]  losartan (COZAAR) 100 MG tablet Take 100 mg by mouth in the morning.  09/01/19  Yes [provider]  metFORMIN (GLUCOPHAGE)  1000 MG tablet Take 1,000 mg by mouth 2 (two) times daily with a meal.   Yes [provider]  metoprolol tartrate (LOPRESSOR) 50 MG tablet Take 50 mg by mouth 2 (two) times daily. 09/14/19  Yes [provider]  norethindrone (AYGESTIN) 5 MG tablet Take 5 mg by mouth in the morning. 02/10/20  Yes [provider]  omeprazole (PRILOSEC) 40 MG capsule Take 40 mg by mouth in the morning.  09/09/19  Yes [provider]  etodolac (LODINE) 300 MG capsule Take 1 capsule (300 mg total) by mouth every 8 (eight) hours. 02/29/20   Dorie Rank, MD    Allergies    Lisinopril, Shellfish allergy, Actos [pioglitazone], Amlodipine, Benadryl [diphenhydramine], and Eliquis [apixaban]  Review of Systems   Review of Systems  Constitutional: Negative for fatigue.  Gastrointestinal: Positive for abdominal pain and nausea.  Genitourinary: Positive for vaginal bleeding. Negative for dysuria.  All other systems reviewed and are negative.   Physical Exam Updated Vital Signs BP (!) 147/88 (BP Location: Left Arm)   Pulse 93   Temp 98.7 F (37.1 C) (Oral)   Resp 18   Ht 1.702 m (5\' 7" )   Wt 122.5 kg   LMP 02/13/2020 (Exact Date)   SpO2 100%   BMI 42.29 kg/m   Physical Exam Vitals and nursing note reviewed. Exam conducted with a chaperone present.  Constitutional:      General: She is not in acute distress.    Appearance: She is well-developed.  HENT:     Head: Normocephalic and atraumatic.     Right Ear: External ear normal.     Left Ear: External ear normal.  Eyes:     General: No scleral icterus.       Right eye: No discharge.        Left eye: No discharge.     Conjunctiva/sclera: Conjunctivae normal.  Neck:     Trachea: No tracheal deviation.  Cardiovascular:     Rate and Rhythm: Normal rate and regular rhythm.  Pulmonary:     Effort: Pulmonary effort is normal. No respiratory distress.     Breath sounds: Normal breath sounds. No stridor. No wheezing or rales.    Abdominal:     General: Bowel sounds are normal. There is no distension.     Palpations: Abdomen is soft.     Tenderness: There is no abdominal tenderness. There is no guarding or rebound.  Genitourinary:    General: Normal vulva.     Labia:        Right: No rash or tenderness.        Left: No rash or tenderness.      Vagina: Bleeding present.  Cervix: Cervical bleeding present. No cervical motion tenderness or friability.     Uterus: Enlarged. Not tender.      Adnexa: Right adnexa normal and left adnexa normal.     Comments: Blood noted in the vaginal vault but no heavy bleeding Musculoskeletal:        General: No tenderness.     Cervical back: Neck supple.  Skin:    General: Skin is warm and dry.     Findings: No rash.  Neurological:     Mental Status: She is alert.     Cranial Nerves: No cranial nerve deficit (no facial droop, extraocular movements intact, no slurred speech).     Sensory: No sensory deficit.     Motor: No abnormal muscle tone or seizure activity.     Coordination: Coordination normal.     ED Results / Procedures / Treatments   Labs (all labs ordered are listed, but only abnormal results are displayed) Labs Reviewed  CBC WITH DIFFERENTIAL/PLATELET - Abnormal; Notable for the following components:      Result Value   RBC 3.72 (*)    Hemoglobin 10.5 (*)    HCT 34.3 (*)    All other components within normal limits  COMPREHENSIVE METABOLIC PANEL - Abnormal; Notable for the following components:   Potassium 5.2 (*)    CO2 19 (*)    Glucose, Bld 202 (*)    BUN 23 (*)    Creatinine, Ser 1.82 (*)    GFR calc non Af Amer 35 (*)    GFR calc Af Amer 41 (*)    All other components within normal limits  SARS CORONAVIRUS 2 BY RT PCR (HOSPITAL ORDER, [place] [facility])  PREGNANCY, URINE  RPR  TYPE AND SCREEN  GC/CHLAMYDIA PROBE AMP ([place]) NOT AT [facility]    EKG None  Radiology CT Renal Stone Study  Result Date:  02/29/2020 CLINICAL DATA:  Flank pain, suspected kidney stone, heavy vaginal bleeding EXAM: CT ABDOMEN AND PELVIS WITHOUT CONTRAST TECHNIQUE: Multidetector CT imaging of the abdomen and pelvis was performed following the standard protocol without IV contrast. COMPARISON:  None FINDINGS: Lower chest: Patchy bilateral ground-glass nodules throughout both the LEFT and RIGHT chest. Suggestion of cavitation, some areas with branching characteristics. No effusion. No consolidation. Hepatobiliary: Noncontrast appearance of liver with normal contour. No focal, suspicious lesion on noncontrast imaging. No pericholecystic stranding. No gross biliary duct distension. Pancreas: Pancreas it with normal contour, no signs of peripancreatic inflammation. Spleen: Spleen normal in size and contour. Adrenals/Urinary Tract: Adrenal glands are normal. No hydronephrosis. No nephrolithiasis. Urinary bladder under distended limiting assessment. Mild perinephric stranding bilaterally. Stomach/Bowel: Stomach under distended limiting assessment. No acute small bowel process. Appendix is normal. No pericolonic stranding. Vascular/Lymphatic: Aorta without calcification or dilation. Limited assessment on noncontrast imaging. There is no gastrohepatic or hepatoduodenal ligament lymphadenopathy. No retroperitoneal or mesenteric lymphadenopathy. No pelvic sidewall lymphadenopathy. Reproductive: Nonspecific globular appearance of the uterus with bulging about the LEFT fundal contour. Uterine leiomyomata were seen on pelvic sonogram from April of 2021 Other: Small amount of free fluid in the pelvis. No adnexal mass. Globular lobulated appearance of the uterus. Musculoskeletal: No acute musculoskeletal process. No destructive bone finding. IMPRESSION: 1. Patchy bilateral ground-glass nodules throughout both the LEFT and RIGHT chest. Suggestion of cavitation though more likely bronchocentric some areas with branching characteristics. Findings are  suspicious for atypical infection, consider COVID-19 infection though findings are not entirely typical for this process. Would also consider septic  emboli, pulmonary vasculitis or less likely neoplasm. High-resolution chest CT which could be performed as an outpatient may be helpful to narrow the differential. 2. No nephrolithiasis or hydronephrosis. 3. Small amount of free fluid in the pelvis, nonspecific, may be physiologic. 4. Nonspecific globular appearance of the uterus with bulging about the LEFT fundal contour. Uterine leiomyomata were seen on pelvic sonogram from April of 2021. Aortic Atherosclerosis (ICD10-I70.0). Electronically Signed   By: Zetta Bills M.D.   On: 02/29/2020 18:23    Procedures Procedures (including critical care time)  Medications Ordered in ED Medications  ketorolac (TORADOL) 30 MG/ML injection 30 mg (30 mg Intravenous Given 02/29/20 1651)    ED Course  I have reviewed the triage vital signs and the nursing notes.  Pertinent labs & imaging results that were available during my care of the patient were reviewed by me and considered in my medical decision making (see chart for details).  Clinical Course as of Feb 29 1920  Sun Feb 29, 2020  1752 Hgb increased from 5 months ago.   [JK]  1752 Cr increase new since previous   [JK]  1754 With increased creatinine, abd pain, will ct to rule out renal stone, obstruction   [JK]  1852 CT scan finding reviewed.  Incidental lung findings noted.  Pt is not having any resp sx.  No findings to suggest septic emboli, malignancy unlikely.  Will add on covid test.   [JK]  1853 CT suggests uterine fibroid but urinary obstruction   [JK]    Clinical Course User Index [JK] Dorie Rank, MD   MDM Rules/Calculators/A&P                          Patient presented to the ED for evaluation abnormal vaginal bleeding.  Patient fortunately is hemodynamically stable.  She is not in any significant pain.  I suspect her bleeding is  related to her uterine fibroids.  Patient's hemoglobin is stable.  Recommend she follow-up with her OB/GYN doctor.  Patient was noted to have increased creatinine compared to previous values.  Is possible this could be related to dehydration.  Encourage her to drink plenty of fluids.  Patient however does have hypertension could be related to that as well.  Discussed having her follow-up with her doctor to have that rechecked.  CT scan was performed there is no evidence of any urinary obstruction.  Incidental findings were noted at the base of the lungs.  Patient's not have any respiratory symptoms.  She is not febrile.  She is not coughing.  I doubt septic emboli and malignancy is unlikely.  Patient did have Covid in the past.  Is possible she may have some residual changes associated with that.  I will send off another Covid test but I did suggest she follow-up with her doctor to have an outpatient CT scan of her chest. Final Clinical Impression(s) / ED Diagnoses Final diagnoses:  Abnormal uterine bleeding (AUB)  Uterine leiomyoma, unspecified location    Rx / DC Orders ED Discharge Orders         Ordered    etodolac (LODINE) 300 MG capsule  Every 8 hours       Note to Pharmacy: As needed for pain   02/29/20 1916           Dorie Rank, MD 02/29/20 1921

## 2020-02-29 NOTE — Discharge Instructions (Addendum)
Take the medications as needed for pain, follow up with your primary care doctor to have your creatinine rechecked and to arrange for a CT scan of your chest to evaluate the incidental inflammation of the lungs noted on the CT scan of your abd, pelvis today.  Follow up with your OB GYN doctor to discuss further treatment of your vaginal bleeding and uterine fibroids

## 2020-02-29 NOTE — ED Notes (Signed)
Given med - progesterone by OB in [place]   Bleeding since the 17th  Stopped med several days ago   Unknown if she has contacted OB regarding bleeding   here for eval

## 2020-02-29 NOTE — ED Triage Notes (Signed)
Pt c/o heavy vaginal bleeding x 17 days. States she saw an OB/Gyn in July to help stop periods due to ongoing anemia, but it did not help and she stopped taking it on Thursday. Pt states she is not pregnant. Pt c/o abdominal pain and n/v.

## 2020-03-01 LAB — GC/CHLAMYDIA PROBE AMP (~~LOC~~) NOT AT ARMC
Chlamydia: NEGATIVE
Comment: NEGATIVE
Comment: NORMAL
Neisseria Gonorrhea: NEGATIVE

## 2020-03-01 LAB — RPR: RPR Ser Ql: NONREACTIVE

## 2020-03-02 LAB — SARS CORONAVIRUS 2 BY RT PCR (HOSPITAL ORDER, PERFORMED IN ~~LOC~~ HOSPITAL LAB): SARS Coronavirus 2: NEGATIVE

## 2021-10-27 IMAGING — CT CT RENAL STONE PROTOCOL
2 of 4 series · 16 of 46 positions shown, 18 images · non-contrast
Comparison: None

CLINICAL DATA: Flank pain, suspected kidney stone, heavy vaginal
bleeding

EXAM:
CT ABDOMEN AND PELVIS WITHOUT CONTRAST
TECHNIQUE: Multidetector CT imaging of the abdomen and pelvis was performed
following the standard protocol without IV contrast.

[Series 2: axial st · axial · 0.89mm/px · z∈[+849,+1289]mm · 13 of 100 slices shown, 15 images]
[im 6/100  soft-tissue]
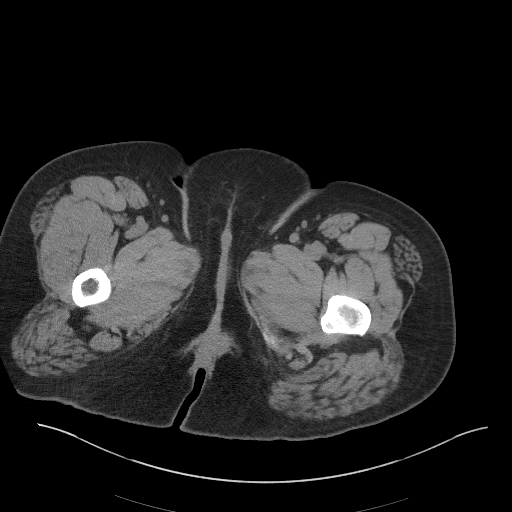
[im 6/100  bone]
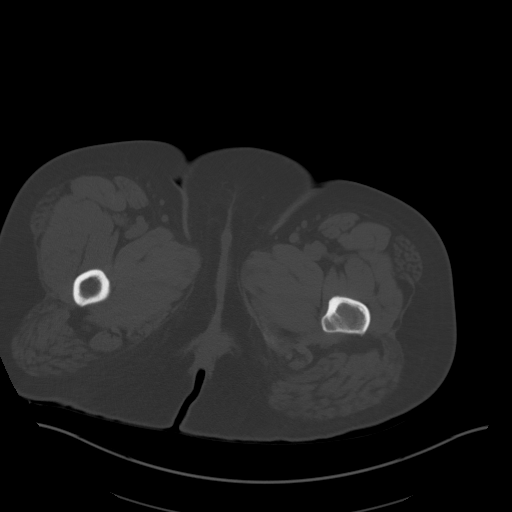
[im 12/100  soft-tissue]
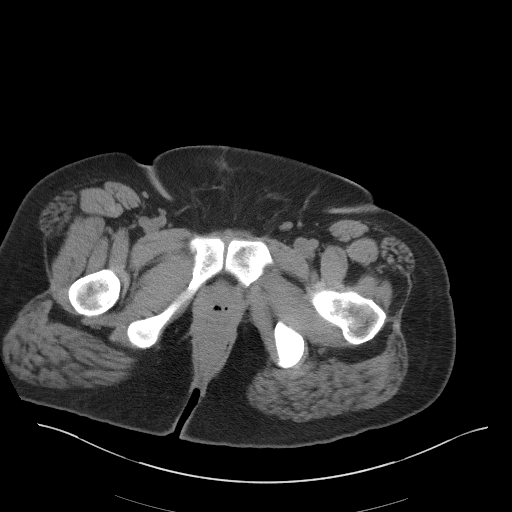
[im 24/100  soft-tissue]
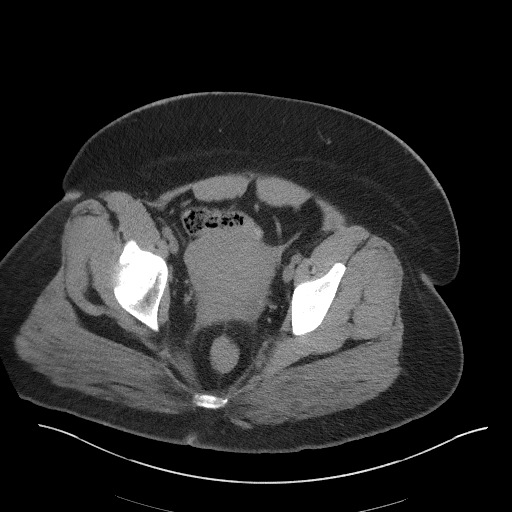
[im 30/100  soft-tissue]
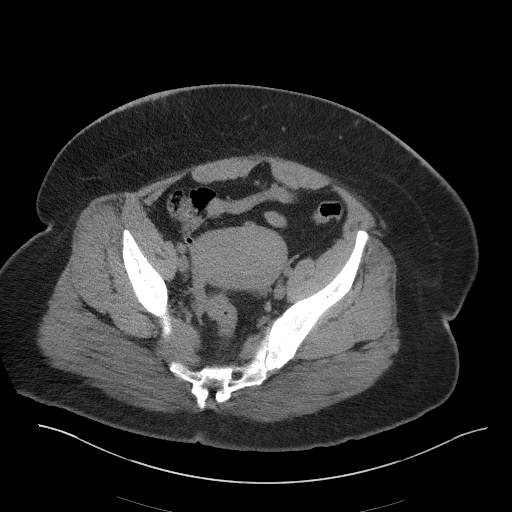
[im 35/100  soft-tissue]
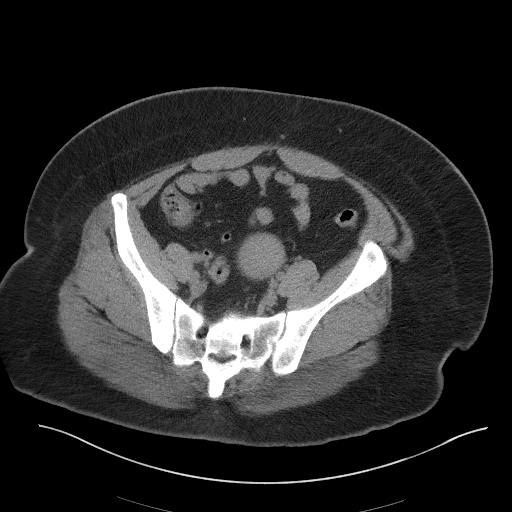
[im 41/100  soft-tissue]
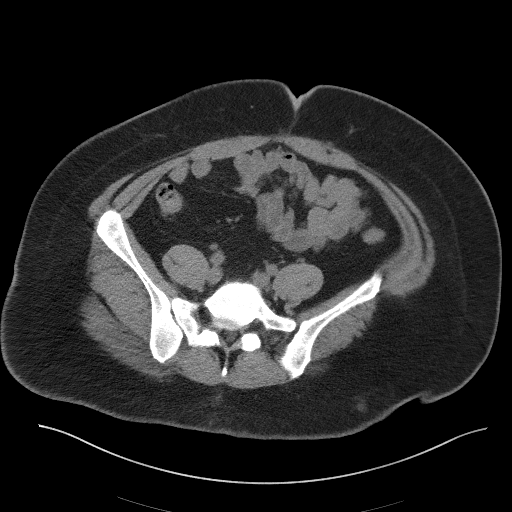
[im 53/100  soft-tissue]
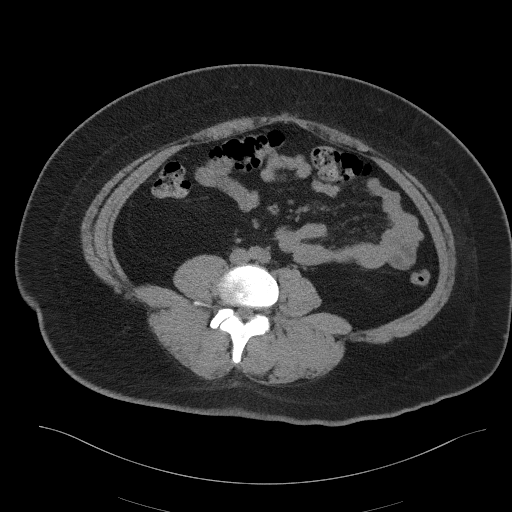
[im 59/100  soft-tissue]
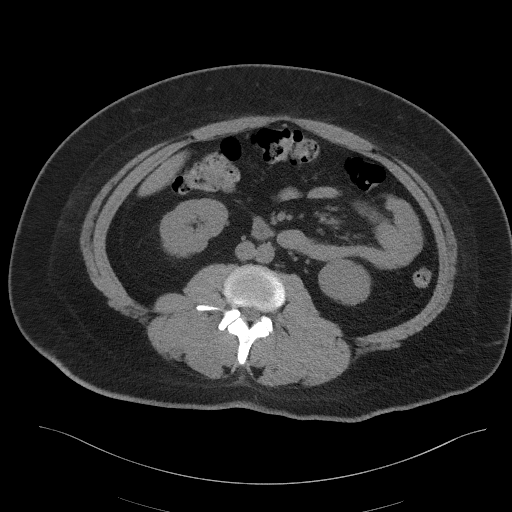
[im 65/100  soft-tissue]
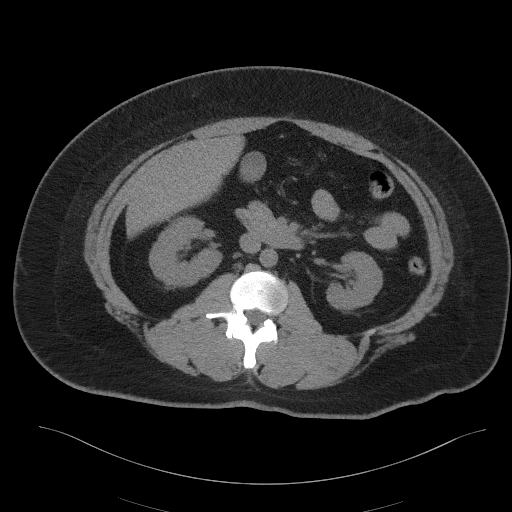
[im 65/100  bone]
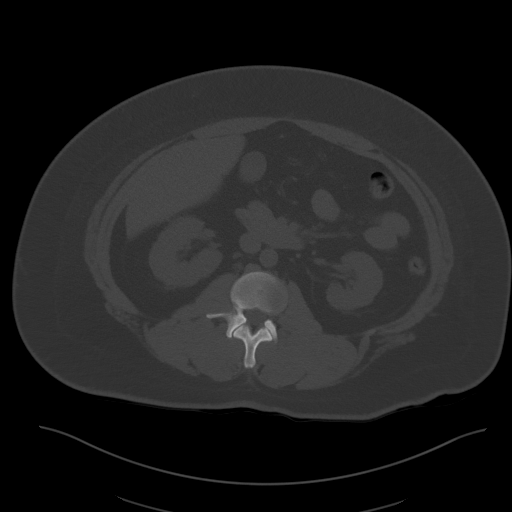
[im 70/100  soft-tissue]
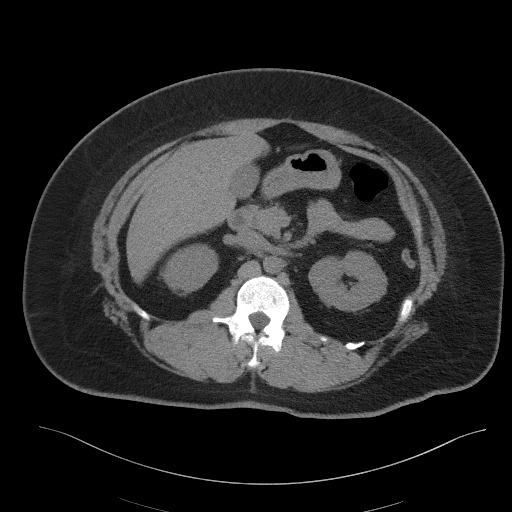
[im 76/100  soft-tissue]
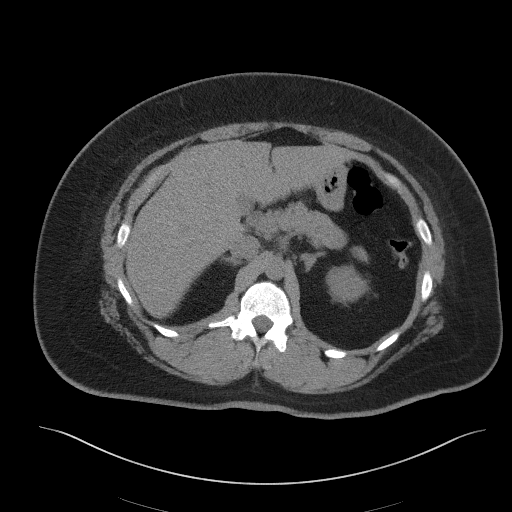
[im 88/100  soft-tissue]
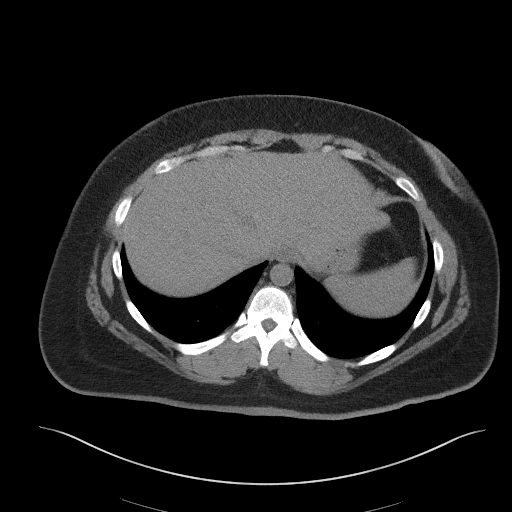
[im 94/100  soft-tissue]
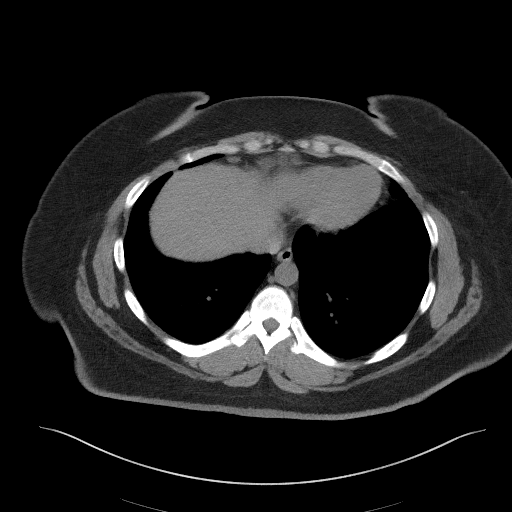

[Series 5: coronal st · coronal · 0.93mm/px · 3 of 120 slices shown]
[im 40/120  soft-tissue]
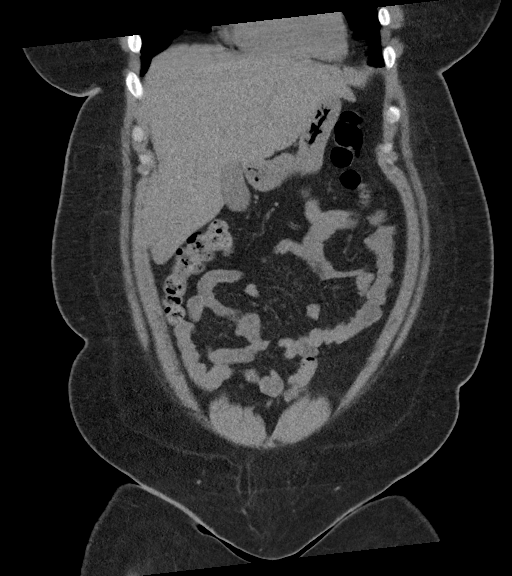
[im 53/120  soft-tissue]
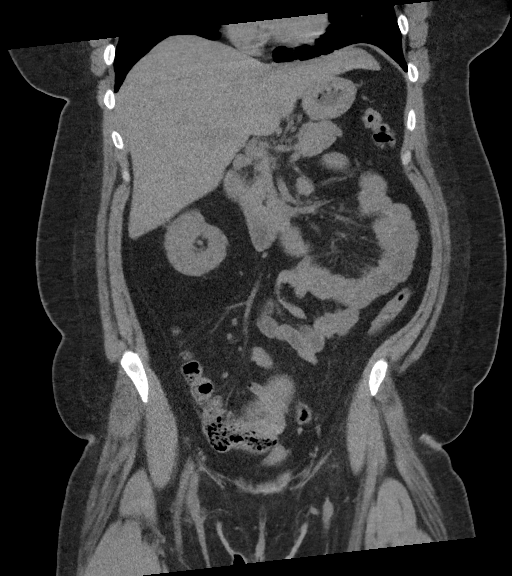
[im 67/120  soft-tissue]
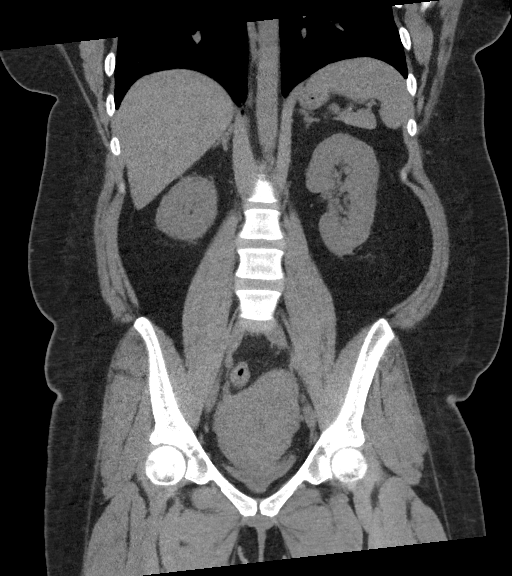

[16 of 46 positions shown; findings below may reference images not displayed]

FINDINGS: Lower chest: Patchy bilateral ground-glass nodules throughout both
the LEFT and RIGHT chest. Suggestion of cavitation, some areas with
branching characteristics. No effusion. No consolidation.

Hepatobiliary: Noncontrast appearance of liver with normal contour.
No focal, suspicious lesion on noncontrast imaging. No
pericholecystic stranding. No gross biliary duct distension.

Pancreas: Pancreas it with normal contour, no signs of
peripancreatic inflammation.

Spleen: Spleen normal in size and contour.

Adrenals/Urinary Tract: Adrenal glands are normal.

No hydronephrosis. No nephrolithiasis. Urinary bladder under
distended limiting assessment. Mild perinephric stranding
bilaterally.

Stomach/Bowel: Stomach under distended limiting assessment. No acute
small bowel process. Appendix is normal. No pericolonic stranding.

Vascular/Lymphatic: Aorta without calcification or dilation. Limited
assessment on noncontrast imaging. There is no gastrohepatic or
hepatoduodenal ligament lymphadenopathy. No retroperitoneal or
mesenteric lymphadenopathy.

No pelvic sidewall lymphadenopathy.

Reproductive: Nonspecific globular appearance of the uterus with
bulging about the LEFT fundal contour. Uterine leiomyomata were seen
on pelvic sonogram from Wednesday September, 2019

Other: Small amount of free fluid in the pelvis. No adnexal mass.
Globular lobulated appearance of the uterus.

Musculoskeletal: No acute musculoskeletal process. No destructive
bone finding.
IMPRESSION: 1. Patchy bilateral ground-glass nodules throughout both the LEFT
and RIGHT chest. Suggestion of cavitation though more likely
bronchocentric some areas with branching characteristics. Findings
are suspicious for atypical infection, consider [id number] infection
though findings are not entirely typical for this process. Would
also consider septic emboli, pulmonary vasculitis or less likely
neoplasm. High-resolution chest CT which could be performed as an
outpatient may be helpful to narrow the differential.
2. No nephrolithiasis or hydronephrosis.
3. Small amount of free fluid in the pelvis, nonspecific, may be
physiologic.
4. Nonspecific globular appearance of the uterus with bulging about
the LEFT fundal contour. Uterine leiomyomata were seen on pelvic
sonogram from Wednesday September, 2019.

Aortic Atherosclerosis ([id number]).
